# Patient Record
Sex: Male | Born: 1985 | Race: Black or African American | Hispanic: No | Marital: Single
Health system: Southern US, Community
[De-identification: ages and names within clinical notes are randomized; demographics above are authoritative.]

---

## 2005-09-25 ENCOUNTER — Emergency Department (HOSPITAL_COMMUNITY): Admission: EM | Admit: 2005-09-25 | Discharge: 2005-09-25 | Payer: Self-pay | Admitting: Emergency Medicine

## 2006-02-13 ENCOUNTER — Emergency Department (HOSPITAL_COMMUNITY): Admission: EM | Admit: 2006-02-13 | Discharge: 2006-02-14 | Payer: Self-pay | Admitting: Emergency Medicine

## 2006-04-22 ENCOUNTER — Emergency Department (HOSPITAL_COMMUNITY): Admission: EM | Admit: 2006-04-22 | Discharge: 2006-04-22 | Payer: Self-pay | Admitting: Emergency Medicine

## 2006-10-24 ENCOUNTER — Emergency Department (HOSPITAL_COMMUNITY): Admission: EM | Admit: 2006-10-24 | Discharge: 2006-10-24 | Payer: Self-pay | Admitting: Emergency Medicine

## 2007-03-26 ENCOUNTER — Emergency Department (HOSPITAL_COMMUNITY): Admission: EM | Admit: 2007-03-26 | Discharge: 2007-03-26 | Payer: Self-pay | Admitting: Emergency Medicine

## 2007-04-12 ENCOUNTER — Emergency Department (HOSPITAL_COMMUNITY): Admission: EM | Admit: 2007-04-12 | Discharge: 2007-04-12 | Payer: Self-pay | Admitting: Emergency Medicine

## 2007-04-16 ENCOUNTER — Emergency Department (HOSPITAL_COMMUNITY): Admission: EM | Admit: 2007-04-16 | Discharge: 2007-04-16 | Payer: Self-pay | Admitting: Emergency Medicine

## 2007-05-05 ENCOUNTER — Ambulatory Visit (HOSPITAL_COMMUNITY): Admission: RE | Admit: 2007-05-05 | Discharge: 2007-05-05 | Payer: Self-pay | Admitting: Pulmonary Disease

## 2008-01-27 ENCOUNTER — Emergency Department (HOSPITAL_COMMUNITY): Admission: EM | Admit: 2008-01-27 | Discharge: 2008-01-27 | Payer: Self-pay | Admitting: Emergency Medicine

## 2008-10-26 ENCOUNTER — Emergency Department (HOSPITAL_COMMUNITY): Admission: EM | Admit: 2008-10-26 | Discharge: 2008-10-26 | Payer: Self-pay | Admitting: *Deleted

## 2009-11-05 ENCOUNTER — Emergency Department (HOSPITAL_COMMUNITY): Admission: EM | Admit: 2009-11-05 | Discharge: 2009-11-05 | Payer: Self-pay | Admitting: Emergency Medicine

## 2011-04-13 ENCOUNTER — Emergency Department (HOSPITAL_COMMUNITY)
Admission: EM | Admit: 2011-04-13 | Discharge: 2011-04-13 | Disposition: A | Discharge status: Home / Self Care | Payer: No Typology Code available for payment source | Attending: Emergency Medicine | Admitting: Emergency Medicine

## 2011-04-13 ENCOUNTER — Emergency Department (HOSPITAL_COMMUNITY): Payer: No Typology Code available for payment source

## 2011-04-13 DIAGNOSIS — T1490XA Injury, unspecified, initial encounter: Secondary | ICD-10-CM | POA: Insufficient documentation

## 2011-04-13 DIAGNOSIS — Y92410 Unspecified street and highway as the place of occurrence of the external cause: Secondary | ICD-10-CM | POA: Insufficient documentation

## 2011-04-13 DIAGNOSIS — M542 Cervicalgia: Secondary | ICD-10-CM | POA: Insufficient documentation
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
## 2014-10-03 ENCOUNTER — Emergency Department (HOSPITAL_COMMUNITY)
Admission: EM | Admit: 2014-10-03 | Discharge: 2014-10-03 | Discharge status: Against Medical Advice | Payer: No Typology Code available for payment source

## 2014-10-03 NOTE — ED Notes (Signed)
Called for triage without response from lobby
